# Patient Record
Sex: Female | Born: 1950 | Race: White | Hispanic: No | Marital: Married | Smoking: Former smoker
Health system: Southern US, Community
[De-identification: ages and names within clinical notes are randomized; demographics above are authoritative.]

---

## 2015-08-28 DIAGNOSIS — J329 Chronic sinusitis, unspecified: Secondary | ICD-10-CM | POA: Diagnosis not present

## 2015-10-11 DIAGNOSIS — M5416 Radiculopathy, lumbar region: Secondary | ICD-10-CM | POA: Diagnosis not present

## 2015-10-11 DIAGNOSIS — M5030 Other cervical disc degeneration, unspecified cervical region: Secondary | ICD-10-CM | POA: Diagnosis not present

## 2015-10-11 DIAGNOSIS — G8929 Other chronic pain: Secondary | ICD-10-CM | POA: Diagnosis not present

## 2015-10-11 DIAGNOSIS — G43009 Migraine without aura, not intractable, without status migrainosus: Secondary | ICD-10-CM | POA: Diagnosis not present

## 2015-10-11 DIAGNOSIS — M5441 Lumbago with sciatica, right side: Secondary | ICD-10-CM | POA: Diagnosis not present

## 2015-10-11 DIAGNOSIS — M542 Cervicalgia: Secondary | ICD-10-CM | POA: Diagnosis not present

## 2015-10-11 DIAGNOSIS — M5412 Radiculopathy, cervical region: Secondary | ICD-10-CM | POA: Diagnosis not present

## 2015-10-11 DIAGNOSIS — G894 Chronic pain syndrome: Secondary | ICD-10-CM | POA: Diagnosis not present

## 2015-10-11 DIAGNOSIS — M5442 Lumbago with sciatica, left side: Secondary | ICD-10-CM | POA: Diagnosis not present

## 2015-11-04 DIAGNOSIS — H40051 Ocular hypertension, right eye: Secondary | ICD-10-CM | POA: Diagnosis not present

## 2015-12-25 DIAGNOSIS — T464X5A Adverse effect of angiotensin-converting-enzyme inhibitors, initial encounter: Secondary | ICD-10-CM | POA: Diagnosis not present

## 2015-12-25 DIAGNOSIS — I10 Essential (primary) hypertension: Secondary | ICD-10-CM | POA: Diagnosis not present

## 2015-12-25 DIAGNOSIS — R05 Cough: Secondary | ICD-10-CM | POA: Diagnosis not present

## 2016-01-21 DIAGNOSIS — I10 Essential (primary) hypertension: Secondary | ICD-10-CM | POA: Diagnosis not present

## 2016-01-21 DIAGNOSIS — M7552 Bursitis of left shoulder: Secondary | ICD-10-CM | POA: Diagnosis not present

## 2016-02-19 DIAGNOSIS — Z0000 Encounter for general adult medical examination without abnormal findings: Secondary | ICD-10-CM | POA: Diagnosis not present

## 2016-02-19 DIAGNOSIS — J302 Other seasonal allergic rhinitis: Secondary | ICD-10-CM | POA: Diagnosis not present

## 2016-02-19 DIAGNOSIS — R05 Cough: Secondary | ICD-10-CM | POA: Diagnosis not present

## 2016-02-19 DIAGNOSIS — J45901 Unspecified asthma with (acute) exacerbation: Secondary | ICD-10-CM | POA: Diagnosis not present

## 2016-03-10 DIAGNOSIS — G8929 Other chronic pain: Secondary | ICD-10-CM | POA: Diagnosis not present

## 2016-03-10 DIAGNOSIS — M5441 Lumbago with sciatica, right side: Secondary | ICD-10-CM | POA: Diagnosis not present

## 2016-03-10 DIAGNOSIS — M5442 Lumbago with sciatica, left side: Secondary | ICD-10-CM | POA: Diagnosis not present

## 2016-03-17 DIAGNOSIS — J45901 Unspecified asthma with (acute) exacerbation: Secondary | ICD-10-CM | POA: Diagnosis not present

## 2016-03-23 ENCOUNTER — Ambulatory Visit: Payer: PPO | Admitting: Allergy and Immunology

## 2016-04-09 DIAGNOSIS — M5441 Lumbago with sciatica, right side: Secondary | ICD-10-CM | POA: Diagnosis not present

## 2016-04-09 DIAGNOSIS — G8929 Other chronic pain: Secondary | ICD-10-CM | POA: Diagnosis not present

## 2016-04-09 DIAGNOSIS — M5412 Radiculopathy, cervical region: Secondary | ICD-10-CM | POA: Diagnosis not present

## 2016-04-09 DIAGNOSIS — M5416 Radiculopathy, lumbar region: Secondary | ICD-10-CM | POA: Diagnosis not present

## 2016-04-09 DIAGNOSIS — M5030 Other cervical disc degeneration, unspecified cervical region: Secondary | ICD-10-CM | POA: Diagnosis not present

## 2016-04-09 DIAGNOSIS — M5442 Lumbago with sciatica, left side: Secondary | ICD-10-CM | POA: Diagnosis not present

## 2016-04-09 DIAGNOSIS — G894 Chronic pain syndrome: Secondary | ICD-10-CM | POA: Diagnosis not present

## 2016-04-09 DIAGNOSIS — M542 Cervicalgia: Secondary | ICD-10-CM | POA: Diagnosis not present

## 2016-04-09 DIAGNOSIS — G43009 Migraine without aura, not intractable, without status migrainosus: Secondary | ICD-10-CM | POA: Diagnosis not present

## 2016-04-15 DIAGNOSIS — K5730 Diverticulosis of large intestine without perforation or abscess without bleeding: Secondary | ICD-10-CM | POA: Diagnosis not present

## 2016-04-15 DIAGNOSIS — K219 Gastro-esophageal reflux disease without esophagitis: Secondary | ICD-10-CM | POA: Diagnosis not present

## 2016-04-15 DIAGNOSIS — R1310 Dysphagia, unspecified: Secondary | ICD-10-CM | POA: Diagnosis not present

## 2016-05-01 DIAGNOSIS — K5730 Diverticulosis of large intestine without perforation or abscess without bleeding: Secondary | ICD-10-CM | POA: Diagnosis not present

## 2016-05-01 DIAGNOSIS — Z9049 Acquired absence of other specified parts of digestive tract: Secondary | ICD-10-CM | POA: Diagnosis not present

## 2016-05-01 DIAGNOSIS — Z1211 Encounter for screening for malignant neoplasm of colon: Secondary | ICD-10-CM | POA: Diagnosis not present

## 2016-05-01 DIAGNOSIS — D124 Benign neoplasm of descending colon: Secondary | ICD-10-CM | POA: Diagnosis not present

## 2016-05-01 DIAGNOSIS — D126 Benign neoplasm of colon, unspecified: Secondary | ICD-10-CM | POA: Diagnosis not present

## 2016-05-01 DIAGNOSIS — J45909 Unspecified asthma, uncomplicated: Secondary | ICD-10-CM | POA: Diagnosis not present

## 2016-05-01 DIAGNOSIS — I10 Essential (primary) hypertension: Secondary | ICD-10-CM | POA: Diagnosis not present

## 2016-05-01 DIAGNOSIS — Z79899 Other long term (current) drug therapy: Secondary | ICD-10-CM | POA: Diagnosis not present

## 2016-05-01 DIAGNOSIS — K635 Polyp of colon: Secondary | ICD-10-CM | POA: Diagnosis not present

## 2016-05-05 DIAGNOSIS — H40051 Ocular hypertension, right eye: Secondary | ICD-10-CM | POA: Diagnosis not present

## 2016-05-05 DIAGNOSIS — H524 Presbyopia: Secondary | ICD-10-CM | POA: Diagnosis not present

## 2016-05-19 DIAGNOSIS — Z23 Encounter for immunization: Secondary | ICD-10-CM | POA: Diagnosis not present

## 2016-05-19 DIAGNOSIS — Z1389 Encounter for screening for other disorder: Secondary | ICD-10-CM | POA: Diagnosis not present

## 2016-05-19 DIAGNOSIS — R042 Hemoptysis: Secondary | ICD-10-CM | POA: Diagnosis not present

## 2016-05-19 DIAGNOSIS — J4541 Moderate persistent asthma with (acute) exacerbation: Secondary | ICD-10-CM | POA: Diagnosis not present

## 2016-05-19 DIAGNOSIS — Z9119 Patient's noncompliance with other medical treatment and regimen: Secondary | ICD-10-CM | POA: Diagnosis not present

## 2016-06-17 ENCOUNTER — Ambulatory Visit (INDEPENDENT_AMBULATORY_CARE_PROVIDER_SITE_OTHER): Payer: PPO | Admitting: Internal Medicine

## 2016-06-17 ENCOUNTER — Other Ambulatory Visit (INDEPENDENT_AMBULATORY_CARE_PROVIDER_SITE_OTHER): Payer: PPO

## 2016-06-17 ENCOUNTER — Ambulatory Visit (INDEPENDENT_AMBULATORY_CARE_PROVIDER_SITE_OTHER)
Admission: RE | Admit: 2016-06-17 | Discharge: 2016-06-17 | Disposition: A | Discharge status: Home / Self Care | Payer: PPO | Source: Ambulatory Visit | Attending: Internal Medicine | Admitting: Internal Medicine

## 2016-06-17 ENCOUNTER — Encounter: Payer: Self-pay | Admitting: Internal Medicine

## 2016-06-17 VITALS — BP 120/84 | HR 84 | Ht 66.5 in | Wt 230.0 lb

## 2016-06-17 DIAGNOSIS — I10 Essential (primary) hypertension: Secondary | ICD-10-CM

## 2016-06-17 DIAGNOSIS — R058 Other specified cough: Secondary | ICD-10-CM

## 2016-06-17 DIAGNOSIS — R05 Cough: Secondary | ICD-10-CM

## 2016-06-17 LAB — CBC WITH DIFFERENTIAL/PLATELET
BASOS ABS: 0.1 10*3/uL (ref 0.0–0.1)
Basophils Relative: 0.9 % (ref 0.0–3.0)
EOS ABS: 0.2 10*3/uL (ref 0.0–0.7)
Eosinophils Relative: 2.4 % (ref 0.0–5.0)
HEMATOCRIT: 45.1 % (ref 36.0–46.0)
Hemoglobin: 15.2 g/dL — ABNORMAL HIGH (ref 12.0–15.0)
LYMPHS PCT: 25.2 % (ref 12.0–46.0)
Lymphs Abs: 1.7 10*3/uL (ref 0.7–4.0)
MCHC: 33.8 g/dL (ref 30.0–36.0)
MCV: 88.8 fl (ref 78.0–100.0)
MONOS PCT: 7.6 % (ref 3.0–12.0)
Monocytes Absolute: 0.5 10*3/uL (ref 0.1–1.0)
NEUTROS ABS: 4.3 10*3/uL (ref 1.4–7.7)
NEUTROS PCT: 63.9 % (ref 43.0–77.0)
Platelets: 251 10*3/uL (ref 150.0–400.0)
RBC: 5.08 Mil/uL (ref 3.87–5.11)
RDW: 13.4 % (ref 11.5–15.5)
WBC: 6.7 10*3/uL (ref 4.0–10.5)

## 2016-06-17 MED ORDER — PREDNISONE 10 MG PO TABS: ORAL_TABLET | ORAL | 0 refills | Status: DC

## 2016-06-17 MED ORDER — LANSOPRAZOLE 30 MG PO CPDR: 30.0000 mg | DELAYED_RELEASE_CAPSULE | Freq: Two times a day (BID) | ORAL | 0 refills | Status: DC

## 2016-06-17 MED ORDER — OXYCODONE HCL 5 MG PO CAPS: 5.0000 mg | ORAL_CAPSULE | Freq: Four times a day (QID) | ORAL | 0 refills | Status: AC | PRN

## 2016-06-17 MED ORDER — IRBESARTAN 150 MG PO TABS: 150.0000 mg | ORAL_TABLET | Freq: Every day | ORAL | 2 refills | Status: AC

## 2016-06-17 MED ORDER — LANSOPRAZOLE 30 MG PO CPDR: 30.0000 mg | DELAYED_RELEASE_CAPSULE | Freq: Two times a day (BID) | ORAL | Status: DC

## 2016-06-17 NOTE — Progress Notes (Signed)
Subjective:    Patient ID: Bethany Hill, female    DOB: 26-Jul-1951,    MRN: [id number]  HPI  77 yowf quit smoking 1990 with onset of stuffy nose in the fall and spring since her late teens and worse since 2012 with bad sinus infections up to 3 x years and in between does fine depending on what she eats/ drinks then around 1st of 2017 developed persistent cough on ACEi which was d/c'd June 2017 with persistent nasal congestion that did improve with prednisone but not the cough so referred to pulmonary clinic 06/17/2016 by Dr   Helene Kelp for refractory cough.    06/17/2016 1st [facility] office visit/ Bethany Hill   Chief Complaint  Patient presents with  . Pulmonary Consult    Self referral. Pt c/o cough since Jan 2017. She states she was taken off of ACE in June 2017 for 2 months and cough never improved. She states dxed with PNA in Sept 2017. Her cough is occ prod with white sputum and is esp worse at night. She states cough wakes her up several times per night. She also c/o DOE with short distances such as just walking to her neighbor's house. She is using proventil 2 x daily on average.    dentist dx sinus infrection > abx > only cleared for a week then "as bad as ever sinus pressure but even s sinus complaints still coughed  Coughs to vomit Cough worse at supper or perfume to point of choking  Better while  sleeping and in am/ min white mucus  No better on rx with saba or ICS per Dr Johnny Bridge notes dated 05/19/16 with nl spirometry at that point    No obvious day to day or daytime variability or assoc sob   purulent sputum or mucus plugs or hemoptysis or cp or chest tightness, subjective wheeze . No unusual exp hx or h/o childhood pna/ asthma or knowledge of premature birth.  Sleeping ok without nocturnal  or early am exacerbation  of respiratory  c/o's or need for noct saba. Also denies any obvious fluctuation of symptoms with weather or environmental changes or other aggravating or  alleviating factors except as outlined above   Current Medications, Allergies, Complete Past Medical History, Past Surgical History, Family History, and Social History were reviewed in [employer] record.      Review of Systems  Constitutional: Negative for chills, fever and unexpected weight change.  HENT: Positive for congestion and sneezing. Negative for dental problem, ear pain, nosebleeds, postnasal drip, rhinorrhea, sinus pressure, sore throat, trouble swallowing and voice change.   Eyes: Negative for visual disturbance.  Respiratory: Positive for cough and shortness of breath. Negative for choking.   Cardiovascular: Negative for chest pain and leg swelling.  Gastrointestinal: Negative for abdominal pain, diarrhea and vomiting.       Acid heartburn  Indigestion  Genitourinary: Negative for difficulty urinating.  Musculoskeletal: Positive for arthralgias.  Skin: Negative for rash.  Neurological: Negative for tremors, syncope and headaches.  Hematological: Does not bruise/bleed easily.       Objective:   Physical Exam  amb somber/ ? Hopeless affect very hoarse    Wt Readings from Last 3 Encounters:  06/17/16 230 lb (104.3 kg)    Vital signs reviewed    HEENT: nl dentition, turbinates, and oropharynx. Nl external ear canals without cough reflex   NECK :  without JVD/Nodes/TM/ nl carotid upstrokes bilaterally   LUNGS: no acc muscle use,  Nl contour chest which is clear to A and P bilaterally with cough on inspiration   CV:  RRR  no s3 or murmur or increase in P2, no edema   ABD:  soft and nontender with nl inspiratory excursion in the supine position. No bruits or organomegaly, bowel sounds nl  MS:  Nl gait/ ext warm without deformities, calf tenderness, cyanosis or clubbing No obvious joint restrictions   SKIN: warm and dry without lesions    NEURO:  alert, approp, nl sensorium with  no motor deficits       CXR PA and Lateral:    06/17/2016 :    I personally reviewed images and agree with radiology impression as follows:    No pulmonary edema. Mild hyperinflation. Streaky left base retrocardiac atelectasis or infiltrate.  Labs ordered 06/17/2016  Allergy profile    Assessment & Plan:

## 2016-06-17 NOTE — Patient Instructions (Addendum)
Please see patient coordinator before you leave today  to schedule sinus CT    Stop lisinopril and symbicort and fish oil   Start ibesartan 150 mg daily - if too strong break in half, if too weak take two   Prevacid 30 mg Take 30- 60 min before your first and last meals of the day and Zantac 150 mg 1 at bedtime  For drainage / throat tickle try take CHLORPHENIRAMINE  4 mg - take one every 4 hours as needed - available over the counter- may cause drowsiness so start with just a bedtime dose or two and see how you tolerate it before trying in daytime    Take delsym two tsp every 12 hours and supplement if needed with oxyIR  up to 2 every 4 hours to suppress the urge to cough. Swallowing water or using ice chips/non mint and menthol containing candies (such as lifesavers or sugarless jolly ranchers) are also effective.  You should rest your voice and avoid activities that you know make you cough.  Once you have eliminated the cough for 3 straight days try reducing the oxyir first,  then the delsym as tolerated.    GERD (REFLUX)  is an extremely common cause of respiratory symptoms just like yours , many times with no obvious heartburn at all.    It can be treated with medication, but also with lifestyle changes including elevation of the head of your bed (ideally with 6 inch  bed blocks),  Smoking cessation, avoidance of late meals, excessive alcohol, and avoid fatty foods, chocolate, peppermint, colas, red wine, and acidic juices such as orange juice.  NO MINT OR MENTHOL PRODUCTS SO NO COUGH DROPS   USE SUGARLESS CANDY INSTEAD (Jolley ranchers or Stover's or Life Savers) or even ice chips will also do - the key is to swallow to prevent all throat clearing. NO OIL BASED VITAMINS - use powdered substitutes.  Please remember to go to the lab and x-ray department downstairs for your tests - we will call you with the results when they are available.     Please schedule a follow up office visit in 2  weeks, sooner if needed

## 2016-06-18 ENCOUNTER — Telehealth: Payer: Self-pay | Admitting: Internal Medicine

## 2016-06-18 DIAGNOSIS — I10 Essential (primary) hypertension: Secondary | ICD-10-CM | POA: Insufficient documentation

## 2016-06-18 LAB — RESPIRATORY ALLERGY PROFILE REGION II ~~LOC~~
Allergen, C. Herbarum, M2: <0.1 kU/L
Allergen, Cottonwood, t14: <0.1 kU/L
Allergen, D pternoyssinus,d7: 0.84 kU/L — ABNORMAL HIGH
Allergen, Mulberry, t76: <0.1 kU/L
Allergen, Oak,t7: <0.1 kU/L
Bermuda Grass: <0.1 kU/L
Box Elder IgE: <0.1 kU/L
Cat Dander: <0.1 kU/L
Cockroach: <0.1 kU/L
D. farinae: 0.82 kU/L — ABNORMAL HIGH
Dog Dander: <0.1 kU/L
Elm IgE: <0.1 kU/L
IGE (IMMUNOGLOBULIN E), SERUM: 154 kU/L — AB (ref <115)
Johnson Grass: <0.1 kU/L
Pecan/Hickory Tree IgE: <0.1 kU/L
Rough Pigweed  IgE: <0.1 kU/L
Timothy Grass: <0.1 kU/L

## 2016-06-18 NOTE — Progress Notes (Signed)
lmtcb

## 2016-06-18 NOTE — Telephone Encounter (Signed)
Called and spoke with pt and she is aware that per MW per CXR was good.  Nothing further is needed.

## 2016-06-18 NOTE — Assessment & Plan Note (Signed)
ACE inhibitors are problematic in  pts with airway complaints because  even experienced pulmonologists can't always distinguish ace effects from copd/asthma.  By themselves they don't actually cause a problem, much like oxygen can't by itself start a fire, but they certainly serve as a powerful catalyst or enhancer for any "fire"  or inflammatory process in the upper airway, be it caused by an ET  tube or more commonly reflux (especially in the obese or pts with known GERD or who are on biphoshonates).    In the era of ARB near equivalency until we have a better handle on the reversibility of the airway problem, it just makes sense to avoid ACEI  entirely in the short run and then decide later, having established a level of airway control using a reasonable limited regimen, whether to add back ace but even then being very careful to observe the pt for worsening airway control and number of meds used/ needed to control symptoms.     For now try avapro 150 mg daily and adjust up or down as needed/ f/u in 2 weeks

## 2016-06-18 NOTE — Assessment & Plan Note (Addendum)
Spirometry 05/19/16 wnl with nl curvature while symptomatic >  try off symbicort 06/17/2016    The most common causes of chronic cough in immunocompetent adults include the following: upper airway cough syndrome (UACS), previously referred to as postnasal drip syndrome (PNDS), which is caused by variety of rhinosinus conditions; (2) asthma; (3) GERD; (4) chronic bronchitis from cigarette smoking or other inhaled environmental irritants; (5) nonasthmatic eosinophilic bronchitis; and (6) bronchiectasis.  These conditions, singly or in combination, have accounted for up to 94% of the causes of chronic cough in prospective studies.   Other conditions have constituted no >6% of the causes in prospective studies These have included bronchogenic carcinoma, chronic interstitial pneumonia, sarcoidosis, left ventricular failure, ACEI-induced cough, and aspiration from a condition associated with pharyngeal dysfunction.    Chronic cough is often simultaneously caused by more than one condition. A single cause has been found from 38 to 82% of the time, multiple causes from 18 to 62%. Multiply caused cough has been the result of three diseases up to 42% of the time.       Based on hx and exam, this is most likely:  Classic Upper airway cough syndrome, so named because it's frequently impossible to sort out how much is  CR/sinusitis with freq throat clearing (which can be related to primary GERD)   vs  causing  secondary (" extra esophageal")  GERD from wide swings in gastric pressure that occur with throat clearing, often  promoting self use of mint and menthol lozenges that reduce the lower esophageal sphincter tone and exacerbate the problem further in a cyclical fashion.   These are the same pts (now being labeled as having "irritable larynx syndrome" by some cough centers) who not infrequently have a history of having failed to tolerate ace inhibitors,  dry powder inhalers or biphosphonates or report having  atypical reflux symptoms that don't respond to standard doses of PPI , and are easily confused as having aecopd or asthma flares by even experienced allergists/ pulmonologists.   The first step is to maximize acid suppression and eliminate ACEi again plus cyclical coughing with IR  then regroup in 2 weeks with w/u for allergy/ sinus dz in meantime but stop rx directed  At asthma for now   Total time devoted to counseling  = 35/7m review case with pt/ discussion of options/alternatives/ personally creating written instructions  in presence of pt  then going over those specific  Instructions directly with the pt including how to use all of the meds but in particular covering each new medication in detail and the difference between the maintenance/automatic meds and the prns using an action plan format for the latter.

## 2016-06-19 NOTE — Progress Notes (Signed)
Spoke with pt and notified of results per Dr. Wert. Pt verbalized understanding and denied any questions. 

## 2016-06-23 ENCOUNTER — Ambulatory Visit (INDEPENDENT_AMBULATORY_CARE_PROVIDER_SITE_OTHER)
Admission: RE | Admit: 2016-06-23 | Discharge: 2016-06-23 | Disposition: A | Discharge status: Home / Self Care | Payer: PPO | Source: Ambulatory Visit | Attending: Internal Medicine | Admitting: Internal Medicine

## 2016-06-23 DIAGNOSIS — R05 Cough: Secondary | ICD-10-CM | POA: Diagnosis not present

## 2016-06-23 DIAGNOSIS — R058 Other specified cough: Secondary | ICD-10-CM

## 2016-06-23 DIAGNOSIS — J3489 Other specified disorders of nose and nasal sinuses: Secondary | ICD-10-CM | POA: Diagnosis not present

## 2016-06-24 NOTE — Progress Notes (Signed)
LMTCB

## 2016-06-26 NOTE — Progress Notes (Signed)
Spoke with pt and notified of results per Dr. Wert. Pt verbalized understanding and denied any questions. 

## 2016-07-06 ENCOUNTER — Encounter: Payer: Self-pay | Admitting: Internal Medicine

## 2016-07-06 ENCOUNTER — Ambulatory Visit (INDEPENDENT_AMBULATORY_CARE_PROVIDER_SITE_OTHER): Payer: PPO | Admitting: Internal Medicine

## 2016-07-06 VITALS — BP 118/74 | HR 70 | Ht 66.5 in | Wt 232.6 lb

## 2016-07-06 DIAGNOSIS — R05 Cough: Secondary | ICD-10-CM | POA: Diagnosis not present

## 2016-07-06 DIAGNOSIS — I10 Essential (primary) hypertension: Secondary | ICD-10-CM | POA: Diagnosis not present

## 2016-07-06 DIAGNOSIS — R058 Other specified cough: Secondary | ICD-10-CM

## 2016-07-06 LAB — NITRIC OXIDE: Nitric Oxide: 28

## 2016-07-06 NOTE — Assessment & Plan Note (Signed)
Spirometry 05/19/16  wnl with nl curvature while symptomatic > try off symbicort 06/17/2016  - Allergy profile 06/17/2016 >  Eos 0.2/  IgE  154  RAST pos Dust - Sinus CT 06/23/2016  > No evidence of sinusitis.  - FENO 07/06/2016  =   29 off ics and steroids in all forms x weeks  - Spirometry 07/06/2016  FEV1 wnl with saba 12h prior to OV   I had an extended discussion with the patient reviewing all relevant studies completed to date and  lasting 15 to 20 minutes of a 25 minute visit on the following ongoing concerns:   1) no evidence at all of asthma here and need to leave off symb indefinitely   2) needs to focus on elimination of pnds/ cyclical cough as per prev instructions then f/u with ov to do a trust by verify approach to management of chronic cough   3) Each maintenance medication was reviewed in detail including most importantly the difference between maintenance and as needed and under what circumstances the prns are to be used.  Please see instructions for details which were reviewed in writing and the patient given a copy.

## 2016-07-06 NOTE — Progress Notes (Signed)
Subjective:    Patient ID: Bethany Hill, female    DOB: 16-Sep-1950,    MRN: [id number]    Brief patient profile:  86 yowf quit smoking 1990 with onset of stuffy nose in the fall and spring since her late teens and worse since 2012 with bad sinus infections up to 3 x years and in between does fine depending on what she eats/ drinks then around 1st of 2017 developed persistent cough on ACEi which was d/c'd June 2017 with persistent nasal congestion that did improve with prednisone but not the cough so referred to pulmonary clinic 06/17/2016 by Dr   Helene Kelp for refractory cough.   History of Present Illness  06/17/2016 1st [facility] office visit/ Bethany Hill   Chief Complaint  Patient presents with  . Pulmonary Consult    Self referral. Pt c/o cough since Jan 2017. She states she was taken off of ACE in June 2017 for 2 months and cough never improved. She states dxed with PNA in Sept 2017. Her cough is occ prod with white sputum and is esp worse at night. She states cough wakes her up several times per night. She also c/o DOE with short distances such as just walking to her neighbor's house. She is using proventil 2 x daily on average.    dentist dx sinus infrection > abx > only cleared for a week then "as bad as ever sinus pressure but even s nasal obst complaints still coughed  Coughs to vomit Cough worse at supper or perfume to point of choking  Better while  sleeping and in am/ min white mucus  No better on rx with saba or ICS per Dr Johnny Bridge notes dated 05/19/16 with nl spirometry at that point  rec Please see patient coordinator before you leave today  to schedule sinus CT   Stop lisinopril and symbicort and fish oil  Start ibesartan 150 mg daily - if too strong break in half, if too weak take two  Prevacid 30 mg Take 30- 60 min before your first and last meals of the day and Zantac 150 mg 1 at bedtime For drainage / throat tickle try take CHLORPHENIRAMINE  4 mg - take one every 4 hours  as needed - available over the counter- may cause drowsiness so start with just a bedtime dose or two and see how you tolerate it before trying in daytime   Take delsym two tsp every 12 hours and supplement if needed with oxyIR  up to 2 every 4 hours to suppress the urge to cough. Swallowing water or using ice chips/non mint and menthol containing candies (such as lifesavers or sugarless jolly ranchers) are also effective.  You should rest your voice and avoid activities that you know make you cough. Once you have eliminated the cough for 3 straight days try reducing the oxyir first,  then the delsym as tolerated.   GERD diet       07/06/2016  f/u ov/Bethany Hill re: cough  ? uacs  Vs asthma last used saba 12 hours  Chief Complaint  Patient presents with  . Follow-up    Breathing is unchanged. Cough has slightly improved. She is using albuterol inhaler 1-2 x daily on average.   only using chlortrimeton three times in 24/ also zyrtec with day > noct sense of pnds but no more cough to vomit  No obvious day to day or daytime variability or assoc excess/ purulent sputum or mucus plugs or hemoptysis or cp  or chest tightness, subjective wheeze or overt sinus or hb symptoms. No unusual exp hx or h/o childhood pna/ asthma or knowledge of premature birth.  Sleeping ok without nocturnal  or early am exacerbation  of respiratory  c/o's or need for noct saba. Also denies any obvious fluctuation of symptoms with weather or environmental changes or other aggravating or alleviating factors except as outlined above   Current Medications, Allergies, Complete Past Medical History, Past Surgical History, Family History, and Social History were reviewed in [employer] record.  ROS  The following are not active complaints unless bolded sore throat, dysphagia, dental problems, itching, sneezing,  nasal congestion or excess/ purulent secretions, ear ache,   fever, chills, sweats, unintended wt loss,  classically pleuritic or exertional cp,  orthopnea pnd or leg swelling, presyncope, palpitations, abdominal pain, anorexia, nausea, vomiting, diarrhea  or change in bowel or bladder habits, change in stools or urine, dysuria,hematuria,  rash, arthralgias, visual complaints, headache, numbness, weakness or ataxia or problems with walking or coordination,  change in mood/affect or memory.                       Objective:  Physical Exam  amb somber with freq throat clearing   Wt Readings from Last 3 Encounters:  07/06/16 232 lb 9.6 oz (105.5 kg)  06/17/16 230 lb (104.3 kg)    Vital signs reviewed -  Note on arrival 02 sats  98% on RA       HEENT: nl dentition, turbinates, and oropharynx which is pristine.  Nl external ear canals without cough reflex   NECK :  without JVD/Nodes/TM/ nl carotid upstrokes bilaterally   LUNGS: no acc muscle use,  Nl contour chest which is clear to A and P bilaterally with cough on inspiration   CV:  RRR  no s3 or murmur or increase in P2, no edema   ABD:  soft and nontender with nl inspiratory excursion in the supine position. No bruits or organomegaly, bowel sounds nl  MS:  Nl gait/ ext warm without deformities, calf tenderness, cyanosis or clubbing No obvious joint restrictions   SKIN: warm and dry without lesions    NEURO:  alert, approp, nl sensorium with  no motor deficits       CXR PA and Lateral:   06/17/2016 :    I personally reviewed images and agree with radiology impression as follows:    No pulmonary edema. Mild hyperinflation. Streaky left base retrocardiac atelectasis or infiltrate.       Assessment & Plan:

## 2016-07-06 NOTE — Assessment & Plan Note (Signed)
D/c ACEi 06/17/2016   Adequate control on present rx, reviewed > no change in rx needed    Although even in retrospect it may not be clear the ACEi contributed to the pt's symptoms,  Pt improved off them and adding them back at this point or in the future would risk confusion in interpretation of non-specific respiratory symptoms to which this patient is prone  ie  Better not to muddy the waters here.

## 2016-07-06 NOTE — Patient Instructions (Addendum)
For drainage / throat tickle try take CHLORPHENIRAMINE  4 mg - take one to  two  every 4 hours as needed - available over the counter- may cause drowsiness so start with just a bedtime dose or two and see how you tolerate it before trying in daytime .   Take delsym two tsp every 12 hours and supplement if needed with oxyIR  up to 2 every 4 hours to suppress the urge to cough. Swallowing water or using ice chips/non mint and menthol containing candies (such as lifesavers or sugarless jolly ranchers) are also effective.  You should rest your voice and avoid activities that you know make you cough. Once you have eliminated the cough for 3 straight days try reducing the oxyir first,  then the delsym as tolerated.   Please schedule a follow up office visit in 3 weeks, sooner if needed with all active meds in hand

## 2016-07-06 NOTE — Assessment & Plan Note (Signed)
Body mass index is 36.98   No results found for: TSH   Contributing to gerd tendency/ doe/reviewed the need and the process to achieve and maintain neg calorie balance > defer f/u primary care including intermittently monitoring thyroid status

## 2016-07-28 ENCOUNTER — Ambulatory Visit (INDEPENDENT_AMBULATORY_CARE_PROVIDER_SITE_OTHER): Payer: PPO | Admitting: Internal Medicine

## 2016-07-28 ENCOUNTER — Encounter: Payer: Self-pay | Admitting: Internal Medicine

## 2016-07-28 VITALS — BP 118/70 | HR 78 | Ht 66.5 in | Wt 231.0 lb

## 2016-07-28 DIAGNOSIS — I10 Essential (primary) hypertension: Secondary | ICD-10-CM

## 2016-07-28 DIAGNOSIS — R05 Cough: Secondary | ICD-10-CM | POA: Diagnosis not present

## 2016-07-28 DIAGNOSIS — R058 Other specified cough: Secondary | ICD-10-CM

## 2016-07-28 MED ORDER — LANSOPRAZOLE 30 MG PO CPDR: 30.0000 mg | DELAYED_RELEASE_CAPSULE | Freq: Two times a day (BID) | ORAL | 2 refills | Status: AC

## 2016-07-28 NOTE — Progress Notes (Signed)
Subjective:    Patient ID: Bethany Hill, female    DOB: 1951/03/04,    MRN: [id number]    Brief patient profile:  69 yowf quit smoking 1990 with onset of stuffy nose in the fall and spring since her late teens and worse since 2012 with bad sinus infections up to 3 x years and in between does fine depending on what she eats/ drinks then around 1st of 2017 developed persistent cough on ACEi which was d/c'd June 2017 with persistent nasal congestion that did improve with prednisone but not the cough so referred to pulmonary clinic 06/17/2016 by Dr Helene Kelp for refractory cough.   History of Present Illness  06/17/2016 1st [facility] office visit/ [name]   Chief Complaint  Patient presents with  . Pulmonary Consult    Self referral. Pt c/o cough since Jan 2017. She states she was taken off of ACE in June 2017 for 2 months and cough never improved. She states dxed with PNA in Sept 2017. Her cough is occ prod with white sputum and is esp worse at night. She states cough wakes her up several times per night. She also c/o DOE with short distances such as just walking to her neighbor's house. She is using proventil 2 x daily on average.    dentist dx sinus infrection > abx > only cleared for a week then "as bad as ever sinus pressure but even s nasal obst complaints still coughed  Coughs to vomit Cough worse at supper or perfume to point of choking  Better while  sleeping and in am/ min white mucus  No better on rx with saba or ICS per Dr Johnny Bridge notes dated 05/19/16 with nl spirometry at that point  rec Please see patient coordinator before you leave today  to schedule sinus CT   Stop lisinopril and symbicort and fish oil  Start ibesartan 150 mg daily - if too strong break in half, if too weak take two  Prevacid 30 mg Take 30- 60 min before your first and last meals of the day and Zantac 150 mg 1 at bedtime For drainage / throat tickle try take CHLORPHENIRAMINE  4 mg - take one every 4 hours  as needed - available over the counter- may cause drowsiness so start with just a bedtime dose or two and see how you tolerate it before trying in daytime   Take delsym two tsp every 12 hours and supplement if needed with oxyIR  up to 2 every 4 hours to suppress the urge to cough. Swallowing water or using ice chips/non mint and menthol containing candies (such as lifesavers or sugarless jolly ranchers) are also effective.  You should rest your voice and avoid activities that you know make you cough. Once you have eliminated the cough for 3 straight days try reducing the oxyir first,  then the delsym as tolerated.   GERD diet       07/06/2016  f/u ov/[name] re: cough  ? uacs  Vs asthma last used saba 12 hours  Chief Complaint  Patient presents with  . Follow-up    Breathing is unchanged. Cough has slightly improved. She is using albuterol inhaler 1-2 x daily on average.   only using chlortrimeton three times in 24/ also zyrtec with day > noct sense of pnds but no more cough to vomit rec For drainage / throat tickle try take CHLORPHENIRAMINE  4 mg - take one to  two  every 4 hours as  needed - available over the counter- may cause drowsiness so start with just a bedtime dose or two and see how you tolerate it before trying in daytime .  Take delsym two tsp every 12 hours and supplement if needed with oxyIR  up to 2 every 4 hours to suppress the urge to cough. Swallowing water or using ice chips/non mint and menthol containing candies (such as lifesavers or sugarless jolly ranchers) are also effective.  You should rest your voice and avoid activities that you know make you cough. Once you have eliminated the cough for 3 straight days try reducing the oxyir first,  then the delsym as tolerated. Please schedule a follow up office visit in 3 weeks, sooner if needed with all active meds in hand     07/28/2016  f/u ov/Kolden Dupee re: uacs / did not bring meds and "can't find chlorpheniramine" / on ppi bid and  h2hs  Chief Complaint  Patient presents with  . Follow-up    3 week follow up. Breathing has been ok for the past few weeks. A few episodes of SOB and chest tightness.    Sleeping fine p h1hs  "when I can find it"  No obvious day to day or daytime variability or assoc excess/ purulent sputum or mucus plugs or hemoptysis or cp or chest tightness, subjective wheeze or overt sinus or hb symptoms. No unusual exp hx or h/o childhood pna/ asthma or knowledge of premature birth.  Sleeping ok without nocturnal  or early am exacerbation  of respiratory  c/o's or need for noct saba. Also denies any obvious fluctuation of symptoms with weather or environmental changes or other aggravating or alleviating factors except as outlined above   Current Medications, Allergies, Complete Past Medical History, Past Surgical History, Family History, and Social History were reviewed in [employer] record.  ROS  The following are not active complaints unless bolded sore throat, dysphagia, dental problems, itching, sneezing,  nasal congestion or excess/ purulent secretions, ear ache,   fever, chills, sweats, unintended wt loss, classically pleuritic or exertional cp,  orthopnea pnd or leg swelling, presyncope, palpitations, abdominal pain, anorexia, nausea, vomiting, diarrhea  or change in bowel or bladder habits, change in stools or urine, dysuria,hematuria,  rash, arthralgias, visual complaints, headache, numbness, weakness or ataxia or problems with walking or coordination,  change in mood/affect or memory.               Objective:  Physical Exam  amb somber with voice fatigue/ hoarseness s excess throat clearing   07/28/2016        231  07/06/16 232 lb 9.6 oz (105.5 kg)  06/17/16 230 lb (104.3 kg)    Vital signs reviewed -   - Note on arrival 02 sats  95% on RA    HEENT: nl dentition, turbinates, and oropharynx which is pristine.  Nl external ear canals without cough  reflex   NECK :  without JVD/Nodes/TM/ nl carotid upstrokes bilaterally   LUNGS: no acc muscle use,  Nl contour chest which is clear to A and P bilaterally with  no ough on inspiration   CV:  RRR  no s3 or murmur or increase in P2, no edema   ABD:  soft and nontender with nl inspiratory excursion in the supine position. No bruits or organomegaly, bowel sounds nl  MS:  Nl gait/ ext warm without deformities, calf tenderness, cyanosis or clubbing No obvious joint restrictions   SKIN: warm and dry  without lesions    NEURO:  alert, approp, nl sensorium with  no motor deficits       CXR PA and Lateral:   06/17/2016 :    I personally reviewed images and agree with radiology impression as follows:   No pulmonary edema. Mild hyperinflation. Streaky left base retrocardiac atelectasis or infiltrate.       Assessment & Plan:   Outpatient Encounter Prescriptions as of 07/28/2016  Medication Sig  . albuterol (PROVENTIL HFA) 108 (90 Base) MCG/ACT inhaler Inhale 2 puffs into the lungs every 6 (six) hours as needed for wheezing or shortness of breath.  Marland Kitchen aspirin 81 MG EC tablet Take 81 mg by mouth daily. Swallow whole.  Marland Kitchen buPROPion (WELLBUTRIN XL) 150 MG 24 hr tablet Take 150 mg by mouth daily.  . carisoprodol (SOMA) 350 MG tablet Take 350 mg by mouth 3 (three) times daily as needed for muscle spasms.  . chlorpheniramine (CHLOR-TRIMETON) 4 MG tablet Take 4 mg by mouth every 4 (four) hours as needed for allergies.  . clobetasol cream (TEMOVATE) [id number] % Apply 1 application topically 2 (two) times daily.  . hydrochlorothiazide (MICROZIDE) 12.5 MG capsule Take 12.5 mg by mouth daily.  . irbesartan (AVAPRO) 150 MG tablet Take 1 tablet (150 mg total) by mouth daily.  . lansoprazole (PREVACID) 30 MG capsule Take 1 capsule (30 mg total) by mouth 2 (two) times daily before a meal.  . MAGNESIUM PO Take 1 tablet by mouth daily.  . metroNIDAZOLE (METROGEL) 0.75 % gel Apply 1 application topically as  directed.  . mometasone (ELOCON) 0.1 % cream Apply 1 application topically daily.  . montelukast (SINGULAIR) 10 MG tablet Take 10 mg by mouth at bedtime.  Marland Kitchen oxycodone (OXY-IR) 5 MG capsule Take 1 capsule (5 mg total) by mouth every 6 (six) hours as needed.  . ranitidine (ZANTAC) 150 MG tablet Take 150 mg by mouth at bedtime.   . verapamil (VERELAN PM) 240 MG 24 hr capsule Take 240 mg by mouth at bedtime.  . [DISCONTINUED] lansoprazole (PREVACID) 30 MG capsule Take 1 capsule (30 mg total) by mouth 2 (two) times daily before a meal.  . [DISCONTINUED] cetirizine (ZYRTEC) 10 MG tablet Take 10 mg by mouth daily.   No facility-administered encounter medications on file as of 07/28/2016.

## 2016-07-28 NOTE — Patient Instructions (Signed)
For drainage / throat tickle try take CHLORPHENIRAMINE  4 mg - take one every 4 hours as needed - available over the counter- may cause drowsiness so start with just a bedtime dose or two and see how you tolerate it before trying in daytime    If not happy after the holidays, call me for referral to [facility]    If you are satisfied with your treatment plan,  let your doctor know and he/she can either refill your medications or you can return here when your prescription runs out.     If in any way you are not 100% satisfied,  please tell us.  If 100% better, tell your friends!  Pulmonary follow up is as needed

## 2016-07-30 NOTE — Assessment & Plan Note (Addendum)
Spirometry 05/19/16  wnl with nl curvature while symptomatic > try off symbicort 06/17/2016  - Allergy profile 06/17/2016 >  Eos 0.2/ IgE  154  RAST pos Dust - Sinus CT 06/23/2016  > No evidence of sinusitis.  - FENO 07/06/2016  =   29 off ics and steroids in all forms x weeks  - Spirometry 07/06/2016  FEV1 wnl with saba 12h prior to OV     In the absence of any evidence of asthma but still freq throat clearing , this is almost certainly Upper airway cough syndrome (previously labeled PNDS) , is  so named because it's frequently impossible to sort out how much is  CR/sinusitis with freq throat clearing (which can be related to primary GERD)   vs  causing  secondary (" extra esophageal")  GERD from wide swings in gastric pressure that occur with throat clearing, often  promoting self use of mint and menthol lozenges that reduce the lower esophageal sphincter tone and exacerbate the problem further in a cyclical fashion.   These are the same pts (now being labeled as having "irritable larynx syndrome" by some cough centers) who not infrequently have a history of having failed to tolerate ace inhibitors,  dry powder inhalers or biphosphonates or report having atypical/extraesophageal reflux symptoms that don't respond to standard doses of PPI  and are easily confused as having aecopd or asthma flares by even experienced allergists/ pulmonologists (myself included).   Therefore rec continue max rx for gerd/ 1st gen H1 per guidelines, and f/u with [facility] voice [facility]/ Dr Joya Gaskins prn   I had an extended discussion with the patient reviewing all relevant studies completed to date and  lasting 15 to 20 minutes of a 25 minute visit    Each maintenance medication was reviewed in detail including most importantly the difference between maintenance and prns and under what circumstances the prns are to be triggered using an action plan format that is not reflected in the computer generated alphabetically organized  AVS.    Please see AVS for unique instructions that I personally wrote and verbalized to the the pt in detail and then reviewed with pt  by my nurse highlighting any  changes in therapy recommended at today's visit to their plan of care.

## 2016-07-30 NOTE — Assessment & Plan Note (Signed)
Although even in retrospect it may not be clear the ACEi contributed to the pt's symptoms,  Pt improved off them and adding them back at this point or in the future would risk confusion in interpretation of non-specific respiratory symptoms to which this patient is prone  ie  Better not to muddy the waters here.   bp ok on avapro 150 mg daily > Follow up per Primary Care planned

## 2016-07-30 NOTE — Assessment & Plan Note (Signed)
Body mass index is 36.73   No results found for: TSH   Contributing to gerd tendency/ doe/reviewed the need and the process to achieve and maintain neg calorie balance > defer f/u primary care including intermittently monitoring thyroid status

## 2016-08-10 DIAGNOSIS — G43009 Migraine without aura, not intractable, without status migrainosus: Secondary | ICD-10-CM | POA: Diagnosis not present

## 2016-08-10 DIAGNOSIS — M542 Cervicalgia: Secondary | ICD-10-CM | POA: Diagnosis not present

## 2016-08-10 DIAGNOSIS — M5441 Lumbago with sciatica, right side: Secondary | ICD-10-CM | POA: Diagnosis not present

## 2016-08-10 DIAGNOSIS — G894 Chronic pain syndrome: Secondary | ICD-10-CM | POA: Diagnosis not present

## 2016-08-10 DIAGNOSIS — M5442 Lumbago with sciatica, left side: Secondary | ICD-10-CM | POA: Diagnosis not present

## 2016-08-10 DIAGNOSIS — M5416 Radiculopathy, lumbar region: Secondary | ICD-10-CM | POA: Diagnosis not present

## 2016-08-10 DIAGNOSIS — G8929 Other chronic pain: Secondary | ICD-10-CM | POA: Diagnosis not present

## 2016-08-31 DIAGNOSIS — J329 Chronic sinusitis, unspecified: Secondary | ICD-10-CM | POA: Diagnosis not present

## 2016-08-31 DIAGNOSIS — J40 Bronchitis, not specified as acute or chronic: Secondary | ICD-10-CM | POA: Diagnosis not present

## 2016-08-31 DIAGNOSIS — L309 Dermatitis, unspecified: Secondary | ICD-10-CM | POA: Diagnosis not present

## 2016-08-31 DIAGNOSIS — I10 Essential (primary) hypertension: Secondary | ICD-10-CM | POA: Diagnosis not present

## 2016-10-23 DIAGNOSIS — J329 Chronic sinusitis, unspecified: Secondary | ICD-10-CM | POA: Diagnosis not present

## 2016-10-23 DIAGNOSIS — R0981 Nasal congestion: Secondary | ICD-10-CM | POA: Diagnosis not present

## 2016-10-23 DIAGNOSIS — R05 Cough: Secondary | ICD-10-CM | POA: Diagnosis not present

## 2016-10-23 DIAGNOSIS — J3489 Other specified disorders of nose and nasal sinuses: Secondary | ICD-10-CM | POA: Diagnosis not present

## 2016-10-23 DIAGNOSIS — J342 Deviated nasal septum: Secondary | ICD-10-CM | POA: Diagnosis not present

## 2016-11-04 DIAGNOSIS — H2513 Age-related nuclear cataract, bilateral: Secondary | ICD-10-CM | POA: Diagnosis not present

## 2016-11-25 ENCOUNTER — Other Ambulatory Visit: Payer: Self-pay | Admitting: Internal Medicine

## 2016-12-15 DIAGNOSIS — G8929 Other chronic pain: Secondary | ICD-10-CM | POA: Diagnosis not present

## 2016-12-15 DIAGNOSIS — M5416 Radiculopathy, lumbar region: Secondary | ICD-10-CM | POA: Diagnosis not present

## 2016-12-15 DIAGNOSIS — M5441 Lumbago with sciatica, right side: Secondary | ICD-10-CM | POA: Diagnosis not present

## 2016-12-15 DIAGNOSIS — M542 Cervicalgia: Secondary | ICD-10-CM | POA: Diagnosis not present

## 2016-12-15 DIAGNOSIS — M5030 Other cervical disc degeneration, unspecified cervical region: Secondary | ICD-10-CM | POA: Diagnosis not present

## 2016-12-15 DIAGNOSIS — M5442 Lumbago with sciatica, left side: Secondary | ICD-10-CM | POA: Diagnosis not present

## 2016-12-15 DIAGNOSIS — G894 Chronic pain syndrome: Secondary | ICD-10-CM | POA: Diagnosis not present

## 2017-03-01 DIAGNOSIS — R600 Localized edema: Secondary | ICD-10-CM | POA: Diagnosis not present

## 2017-03-01 DIAGNOSIS — Z6837 Body mass index (BMI) 37.0-37.9, adult: Secondary | ICD-10-CM | POA: Diagnosis not present

## 2017-03-01 DIAGNOSIS — Z0000 Encounter for general adult medical examination without abnormal findings: Secondary | ICD-10-CM | POA: Diagnosis not present

## 2017-03-01 DIAGNOSIS — M189 Osteoarthritis of first carpometacarpal joint, unspecified: Secondary | ICD-10-CM | POA: Diagnosis not present

## 2017-03-04 DIAGNOSIS — M1811 Unilateral primary osteoarthritis of first carpometacarpal joint, right hand: Secondary | ICD-10-CM | POA: Diagnosis not present

## 2017-03-05 DIAGNOSIS — M1811 Unilateral primary osteoarthritis of first carpometacarpal joint, right hand: Secondary | ICD-10-CM | POA: Diagnosis not present

## 2017-03-12 DIAGNOSIS — R600 Localized edema: Secondary | ICD-10-CM | POA: Diagnosis not present

## 2017-03-12 DIAGNOSIS — R0781 Pleurodynia: Secondary | ICD-10-CM | POA: Diagnosis not present

## 2017-03-12 DIAGNOSIS — Z9181 History of falling: Secondary | ICD-10-CM | POA: Diagnosis not present

## 2017-03-12 DIAGNOSIS — Z6836 Body mass index (BMI) 36.0-36.9, adult: Secondary | ICD-10-CM | POA: Diagnosis not present

## 2017-04-15 DIAGNOSIS — Z79891 Long term (current) use of opiate analgesic: Secondary | ICD-10-CM | POA: Diagnosis not present

## 2017-04-15 DIAGNOSIS — G894 Chronic pain syndrome: Secondary | ICD-10-CM | POA: Diagnosis not present

## 2017-04-15 DIAGNOSIS — M069 Rheumatoid arthritis, unspecified: Secondary | ICD-10-CM | POA: Diagnosis not present

## 2017-04-15 DIAGNOSIS — M1811 Unilateral primary osteoarthritis of first carpometacarpal joint, right hand: Secondary | ICD-10-CM | POA: Diagnosis not present

## 2017-04-15 DIAGNOSIS — M62830 Muscle spasm of back: Secondary | ICD-10-CM | POA: Diagnosis not present

## 2017-04-15 DIAGNOSIS — M961 Postlaminectomy syndrome, not elsewhere classified: Secondary | ICD-10-CM | POA: Diagnosis not present

## 2017-05-05 DIAGNOSIS — H524 Presbyopia: Secondary | ICD-10-CM | POA: Diagnosis not present

## 2017-05-05 DIAGNOSIS — H2513 Age-related nuclear cataract, bilateral: Secondary | ICD-10-CM | POA: Diagnosis not present

## 2017-05-05 DIAGNOSIS — H40051 Ocular hypertension, right eye: Secondary | ICD-10-CM | POA: Diagnosis not present

## 2017-05-06 IMAGING — CT CT PARANASAL SINUSES LIMITED
1 of 2 series · 8 of 11 positions shown, 10 images · non-contrast
Comparison: None.

CLINICAL DATA: Chronic sinus pressure, drainage, productive cough.

EXAM:
CT PARANASAL SINUS LIMITED WITHOUT CONTRAST
TECHNIQUE: Non-contiguous multidetector CT images of the paranasal sinuses were
obtained in a single plane without contrast.

[Series 4: limited sinus st · axial · 0.25mm/px · z∈[+113,+183]mm · 8 of 10 slices shown, 10 images]
[im 2/10  brain]
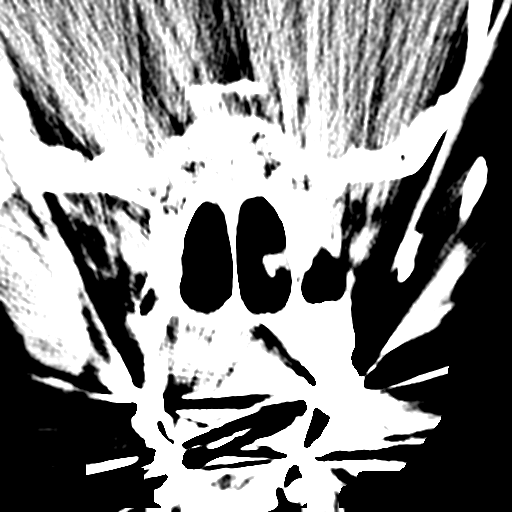
[im 2/10  bone]
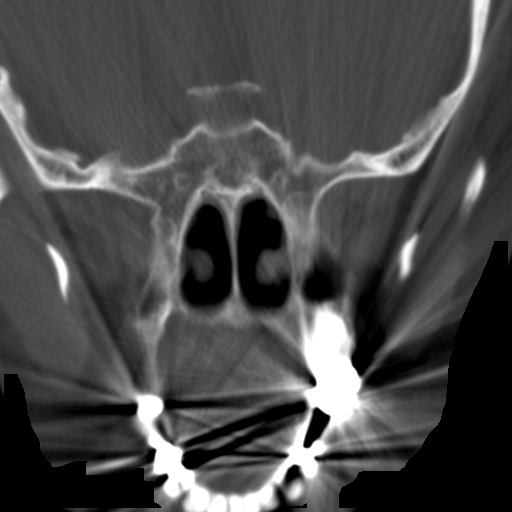
[im 3/10  bone]
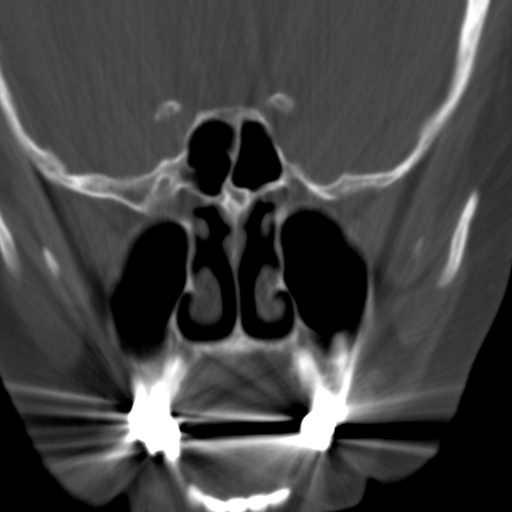
[im 4/10  bone]
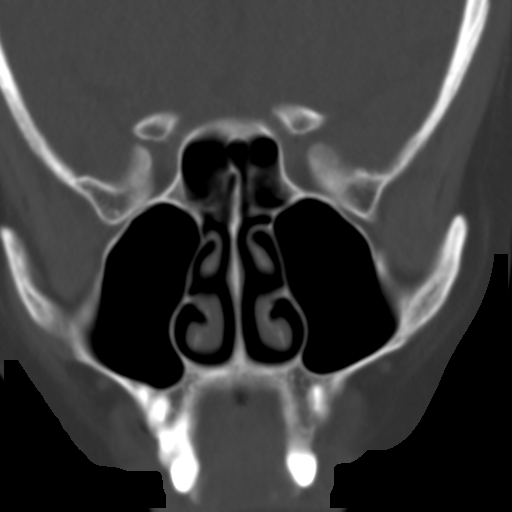
[im 5/10  bone]
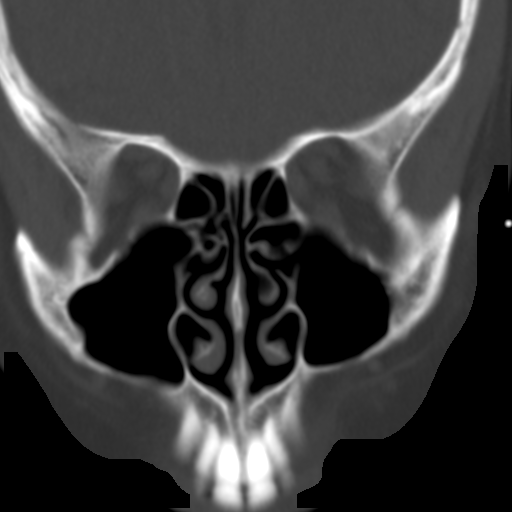
[im 6/10  brain]
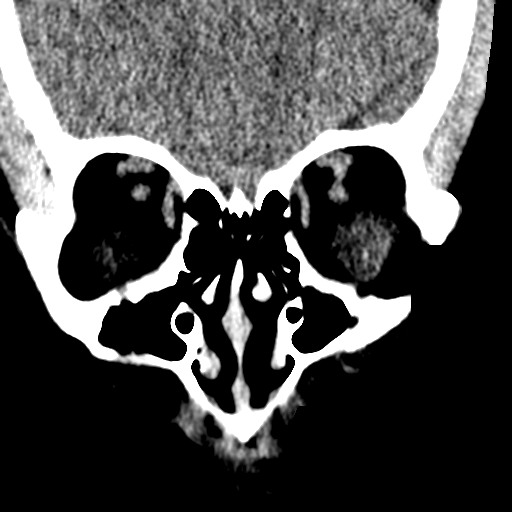
[im 6/10  bone]
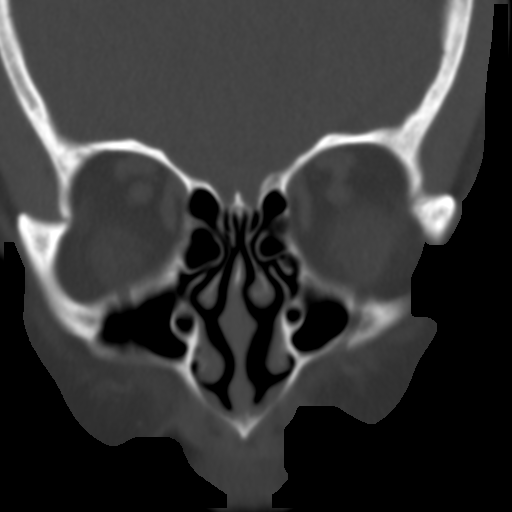
[im 7/10  bone]
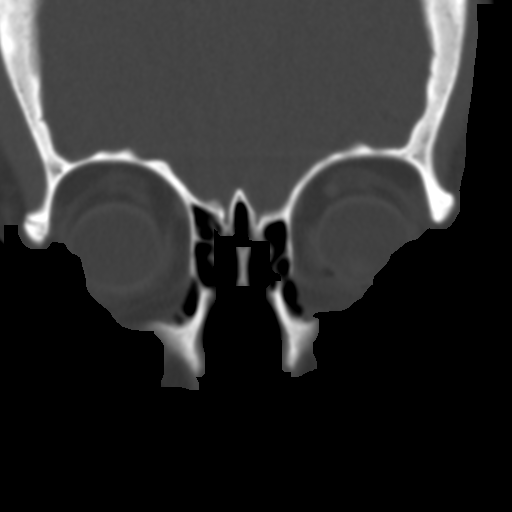
[im 8/10  bone]
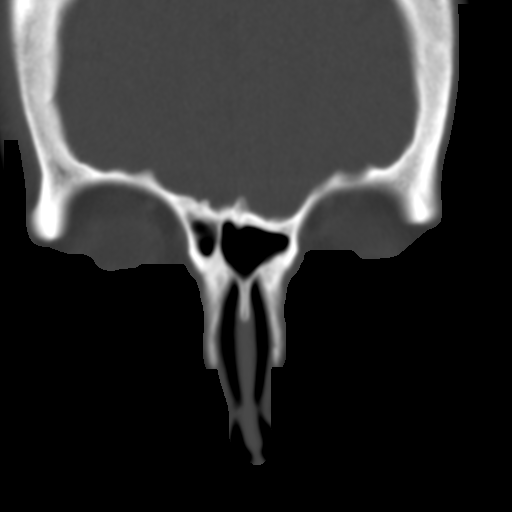
[im 9/10  bone]
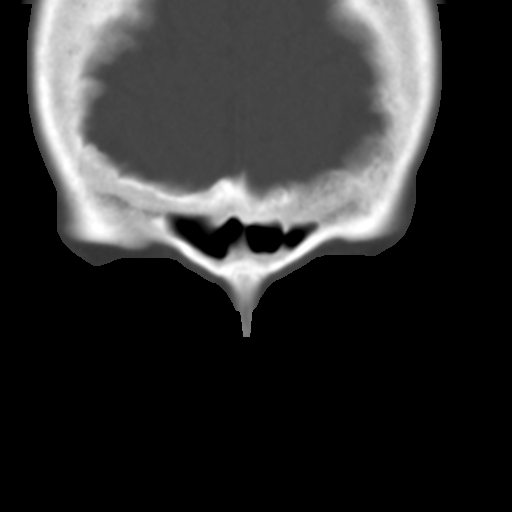

[8 of 11 positions shown; findings below may reference images not displayed]

FINDINGS: No mucosal thickening or air-fluid levels within the paranasal
sinuses. Visualized orbital soft tissues and bony structures
unremarkable.
IMPRESSION: No evidence of sinusitis.

## 2017-05-12 DIAGNOSIS — G894 Chronic pain syndrome: Secondary | ICD-10-CM | POA: Diagnosis not present

## 2017-05-12 DIAGNOSIS — M62830 Muscle spasm of back: Secondary | ICD-10-CM | POA: Diagnosis not present

## 2017-05-12 DIAGNOSIS — M961 Postlaminectomy syndrome, not elsewhere classified: Secondary | ICD-10-CM | POA: Diagnosis not present

## 2017-05-12 DIAGNOSIS — M069 Rheumatoid arthritis, unspecified: Secondary | ICD-10-CM | POA: Diagnosis not present

## 2017-06-16 DIAGNOSIS — M62830 Muscle spasm of back: Secondary | ICD-10-CM | POA: Diagnosis not present

## 2017-06-16 DIAGNOSIS — M961 Postlaminectomy syndrome, not elsewhere classified: Secondary | ICD-10-CM | POA: Diagnosis not present

## 2017-06-16 DIAGNOSIS — J302 Other seasonal allergic rhinitis: Secondary | ICD-10-CM | POA: Diagnosis not present

## 2017-06-16 DIAGNOSIS — M7552 Bursitis of left shoulder: Secondary | ICD-10-CM | POA: Diagnosis not present

## 2017-06-16 DIAGNOSIS — G894 Chronic pain syndrome: Secondary | ICD-10-CM | POA: Diagnosis not present

## 2017-06-16 DIAGNOSIS — M069 Rheumatoid arthritis, unspecified: Secondary | ICD-10-CM | POA: Diagnosis not present

## 2017-06-16 DIAGNOSIS — I10 Essential (primary) hypertension: Secondary | ICD-10-CM | POA: Diagnosis not present

## 2017-06-16 DIAGNOSIS — K219 Gastro-esophageal reflux disease without esophagitis: Secondary | ICD-10-CM | POA: Diagnosis not present
